# Patient Record
Sex: Male | Born: 1990 | Race: White | Hispanic: No | Marital: Single | Smoking: Never smoker
Health system: Southern US, Community
[De-identification: ages and names within clinical notes are randomized; demographics above are authoritative.]

---
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
## 2011-04-02 NOTE — Progress Notes (Signed)
Summary: foot injury/TM (rm 5)   Vital Signs:  Patient Profile:   20 Years Old Male CC:      right foot pain, swelling and redness x 5 days Height:     72 inches Weight:      136 pounds O2 Sat:      100 % O2 treatment:    Room Air Temp:     97.7 degrees F oral Pulse rate:   80 / minute Resp:     14 per minute BP sitting:   121 / 82  (left arm) Cuff size:   regular  Pt. in pain?   yes    Location:   rigth foot    Type:       dull  Vitals Entered By: Lajean Saver RN (August 27, 2010 2:06 PM)                   Updated Prior Medication List: No Medications Current Allergies: No known allergies History of Present Illness Chief Complaint: right foot pain, swelling and redness x 5 days History of Present Illness:  Subjective:  Patient complains of onset of a mildly tender bump on his left first foot MTP joint about 5 days ago that has gradually become more tender, swollen, warm, and erythematous.  No fevers, chills, and sweats.  He has pain with weight bearing.  He plays hockey, but recalls no recent injury, and no recent change in shoe wear. No family history of gout  REVIEW OF SYSTEMS Constitutional Symptoms      Denies fever, chills, night sweats, weight loss, weight gain, and fatigue.  Eyes       Denies change in vision, eye pain, eye discharge, glasses, contact lenses, and eye surgery. Ear/Nose/Throat/Mouth       Denies hearing loss/aids, change in hearing, ear pain, ear discharge, dizziness, frequent runny nose, frequent nose bleeds, sinus problems, sore throat, hoarseness, and tooth pain or bleeding.  Respiratory       Denies dry cough, productive cough, wheezing, shortness of breath, asthma, bronchitis, and emphysema/COPD.  Cardiovascular       Denies murmurs, chest pain, and tires easily with exhertion.    Gastrointestinal       Denies stomach pain, nausea/vomiting, diarrhea, constipation, blood in bowel movements, and indigestion. Genitourniary       Denies  painful urination, kidney stones, and loss of urinary control. Neurological       Denies paralysis, seizures, and fainting/blackouts. Musculoskeletal       Denies muscle pain, joint pain, joint stiffness, decreased range of motion, redness, swelling, muscle weakness, and gout.  Skin       Denies bruising, unusual mles/lumps or sores, and hair/skin or nail changes.  Psych       Denies mood changes, temper/anger issues, anxiety/stress, speech problems, depression, and sleep problems. Other Comments: Unknnown cause of right foot pain, redness and swelling. Warm to touch   Past History:  Past Medical History: Unremarkable  Past Surgical History: Denies surgical history  Family History: none  Social History: Never Smoked Alcohol use-no Drug use-no Smoking Status:  never Drug Use:  no   Objective:  Appearance:  Patient appears healthy, stated age, and in no acute distress  Left ankle:  Full range of motion without tenderness Left foot:  Distinct tenderness and prominence over the first MTP joint medially with overlying warmth and erythema extending to dorsum.  Minimal swelling.  No skin lesions or fluctuance.  Has pain with resisted dorsiflexion  of great toe. X-ray left foot:  Findings: No acute fracture is seen.  There is a fairly well corticated calcific density just medial to the head of the right first metatarsal with soft tissue swelling.  This is not typical of a sesamoid bone and could possibly be due to heterotopic bone from prior trauma.  No erosion is seen to indicate an arthritis.  Joint spaces appear normal.  Tarsal - metatarsal alignment is normal. 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 ETIOLOGY.  ? GOUT.  Plan New Medications/Changes: LORTAB 5 5-500 MG TABS (HYDROCODONE-ACETAMINOPHEN) One tab by  mouth hs as needed pain  #8 (eight) x 0, 08/27/2010, Donna Christen MD PREDNISONE 10 MG TABS (PREDNISONE) 2 PO today, then 2  BID for 2 days, then 1 two times a day for 2 days, then 1 daily for 2 days.  Take PC  #14 x 0, 08/27/2010, Donna Christen MD  New Orders: Services provided After hours-Weekends-Holidays [99051] T-DG Foot Complete*L* [73630] CBC w/Diff [16109-60454] T-Uric Acid (Blood) [09811-91478] New Patient Level IV [99204] Planning Comments:   Check serum uric acid.  Begin tapering course of prednisone.  Begin applying ice pack several times daily. Follow-up with orthopedist.  Limit athletic activity until evaluation by orthopedist.   The patient and/or caregiver has been counseled thoroughly with regard to medications prescribed including dosage, schedule, interactions, rationale for use, and possible side effects and they verbalize understanding.  Diagnoses and expected course of recovery discussed and will return if not improved as expected or if the condition worsens. Patient and/or caregiver verbalized understanding.  Prescriptions: LORTAB 5 5-500 MG TABS (HYDROCODONE-ACETAMINOPHEN) One tab by mouth hs as needed pain  #8 (eight) x 0   Entered and Authorized by:   Donna Christen MD   Signed by:   Donna Christen MD on 08/27/2010   Method used:   Print then Give to Patient   RxID:   [id number] PREDNISONE 10 MG TABS (PREDNISONE) 2 PO today, then 2  BID for 2 days, then 1 two times a day for 2 days, then 1 daily for 2 days.  Take PC  #14 x 0   Entered and Authorized by:   Donna Christen MD   Signed by:   Donna Christen MD on 08/27/2010   Method used:   Print then Give to Patient   RxID:   [id number]   Orders Added: 1)  Services provided After hours-Weekends-Holidays [99051] 2)  T-DG Foot Complete*L* [73630] 3)  CBC w/Diff [72536-64403] 4)  T-Uric Acid (Blood) [47425-95638] 5)  New Patient Level IV [75643]

## 2011-04-02 NOTE — Progress Notes (Signed)
Summary: Followup Call  Followup call to Dad's cell phone; notified or uric acid results. Donna Christen MD  August 28, 2010 11:06 AM

## 2011-11-16 ENCOUNTER — Emergency Department
Admission: EM | Admit: 2011-11-16 | Discharge: 2011-11-16 | Disposition: A | Discharge status: Home / Self Care | Payer: 59 | Source: Home / Self Care

## 2011-11-16 DIAGNOSIS — H6090 Unspecified otitis externa, unspecified ear: Secondary | ICD-10-CM

## 2011-11-16 DIAGNOSIS — H60399 Other infective otitis externa, unspecified ear: Secondary | ICD-10-CM

## 2011-11-16 MED ORDER — NEOMYCIN-POLYMYXIN-HC 3.5-10000-1 OT SOLN: 3.0000 [drp] | Freq: Four times a day (QID) | OTIC | Status: AC

## 2011-11-16 NOTE — ED Notes (Signed)
Bruce Williamson complains of ear pain for 3-4 weeks. Denies fever, chills or sweats. He was treated a few weeks ago for his ear pain and was treated with amoxicillin.

## 2011-11-16 NOTE — ED Provider Notes (Signed)
History     CSN: [id number]  Arrival date & time 11/16/11  1650   First MD Initiated Contact with Patient 11/16/11 1656      Chief Complaint  Patient presents with  . Otalgia    3-4 weeks  Patient is a 21 y.o. male presenting with ear pain.  Otalgia This is a recurrent problem. The current episode started more than 1 week ago. There is pain in the right ear. The problem occurs constantly. The problem has not changed since onset.There has been no fever. Pertinent negatives include no ear discharge, no headaches and no hearing loss. His past medical history does not include chronic ear infection. Past medical history comments: Pt treated by PCP with amoxicillin 2 weeks ago with no improvement in sxs. .  Pt has been swimming daily this summer.   History reviewed. No pertinent past medical history.  History reviewed. No pertinent past surgical history.  Family History  Problem Relation Age of Onset  . Cancer Neg Hx   . Heart failure Neg Hx     History  Substance Use Topics  . Smoking status: Never Smoker   . Smokeless tobacco: Never Used  . Alcohol Use: No      Review of Systems  HENT: Positive for ear pain. Negative for hearing loss and ear discharge.   Neurological: Negative for headaches.  All other systems reviewed and are negative.    Allergies  Review of patient's allergies indicates no known allergies.  Home Medications   Current Outpatient Rx  Name Route Sig Dispense Refill  . NEOMYCIN-POLYMYXIN-HC 3.5-10000-1 OT SOLN Right Ear Place 3 drops into the right ear 4 (four) times daily. 10 mL 0    BP 128/81  Pulse 66  Temp 98 F (36.7 C) (Oral)  Resp 16  Ht 6\' 1"  (1.854 m)  Wt 150 lb (68.04 kg)  BMI 19.79 kg/m2  SpO2 100%  Physical Exam  Constitutional: He appears well-developed and well-nourished.  HENT:  Head: Normocephalic and atraumatic.       R ear canal erythema and tenderness to otoscopic evaluation.   Eyes: Conjunctivae are normal. Pupils are  equal, round, and reactive to light.  Neck: Normal range of motion. Neck supple.  Cardiovascular: Normal rate and regular rhythm.   Pulmonary/Chest: Effort normal.  Abdominal: Soft.  Musculoskeletal: Normal range of motion.  Neurological: He is alert.    ED Course  Procedures (including critical care time)  Labs Reviewed - No data to display No results found.   1. Otitis externa       MDM  Will treat with cortisporin otic Discussed swimming pools and ear care.  Infectious red flags discussed.  Handout given.  Follow up as needed.      The patient and/or caregiver has been counseled thoroughly with regard to treatment plan and/or medications prescribed including dosage, schedule, interactions, rationale for use, and possible side effects and they verbalize understanding. Diagnoses and expected course of recovery discussed and will return if not improved as expected or if the condition worsens. Patient and/or caregiver verbalized understanding.             Floydene Flock, MD 11/16/11 1745

## 2011-11-17 NOTE — ED Provider Notes (Signed)
Agree with exam, assessment, and plan.   Lattie Haw, MD 11/17/11 [phone]

## 2012-05-07 IMAGING — CR DG FOOT 2V*R*
3 series · 3 of 3 positions shown · non-contrast
Comparison: None.

CLINICAL DATA: Pain, no trauma

RIGHT FOOT - 2 VIEW

[view not recorded (1 of 3)]
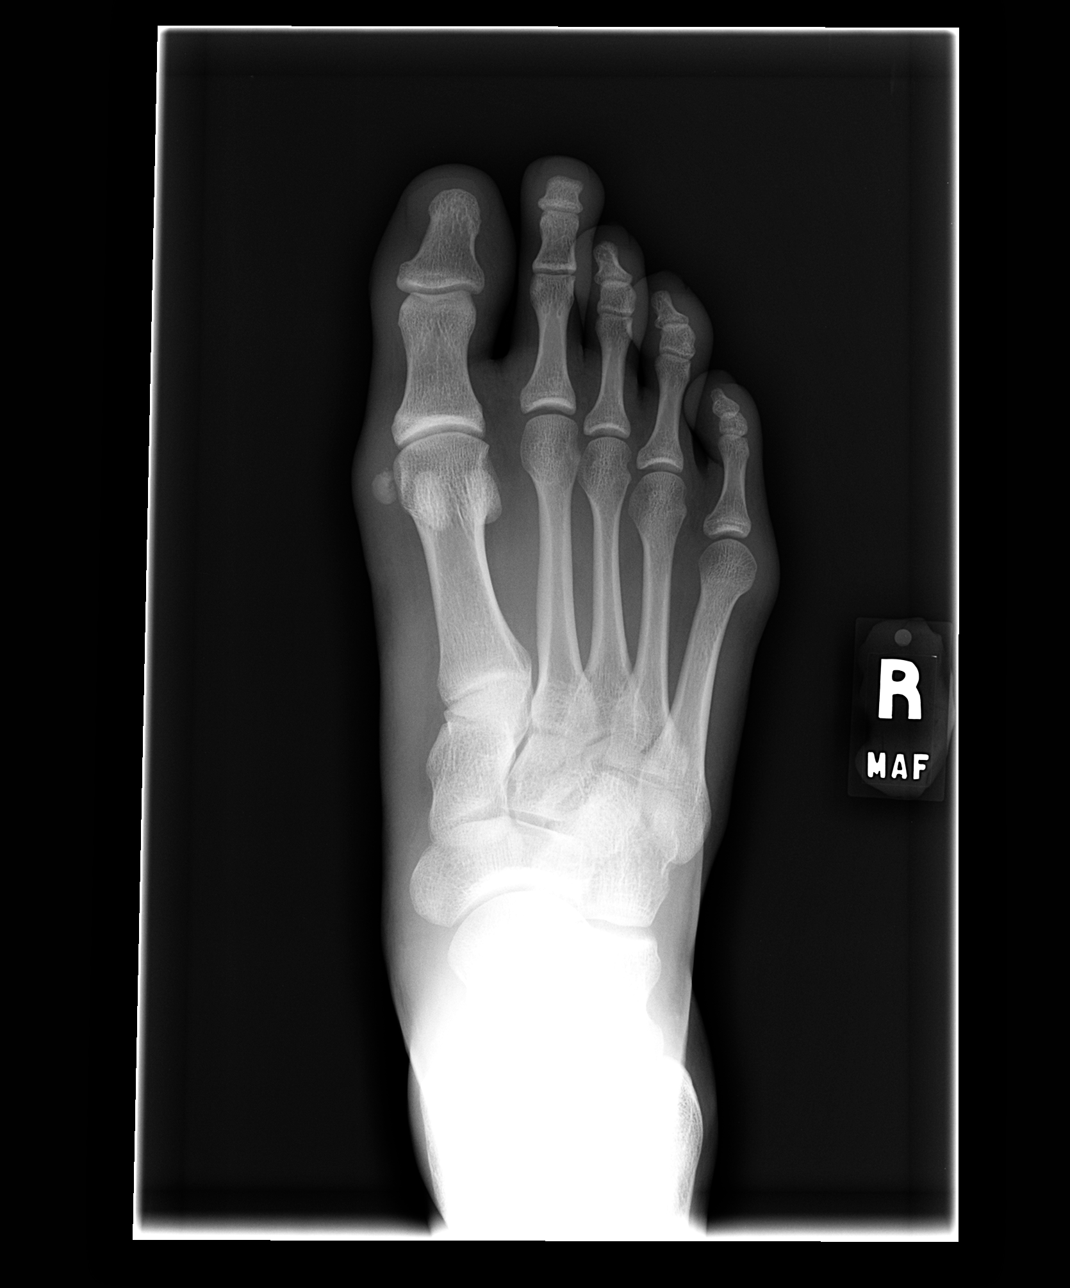

[view not recorded (2 of 3)]
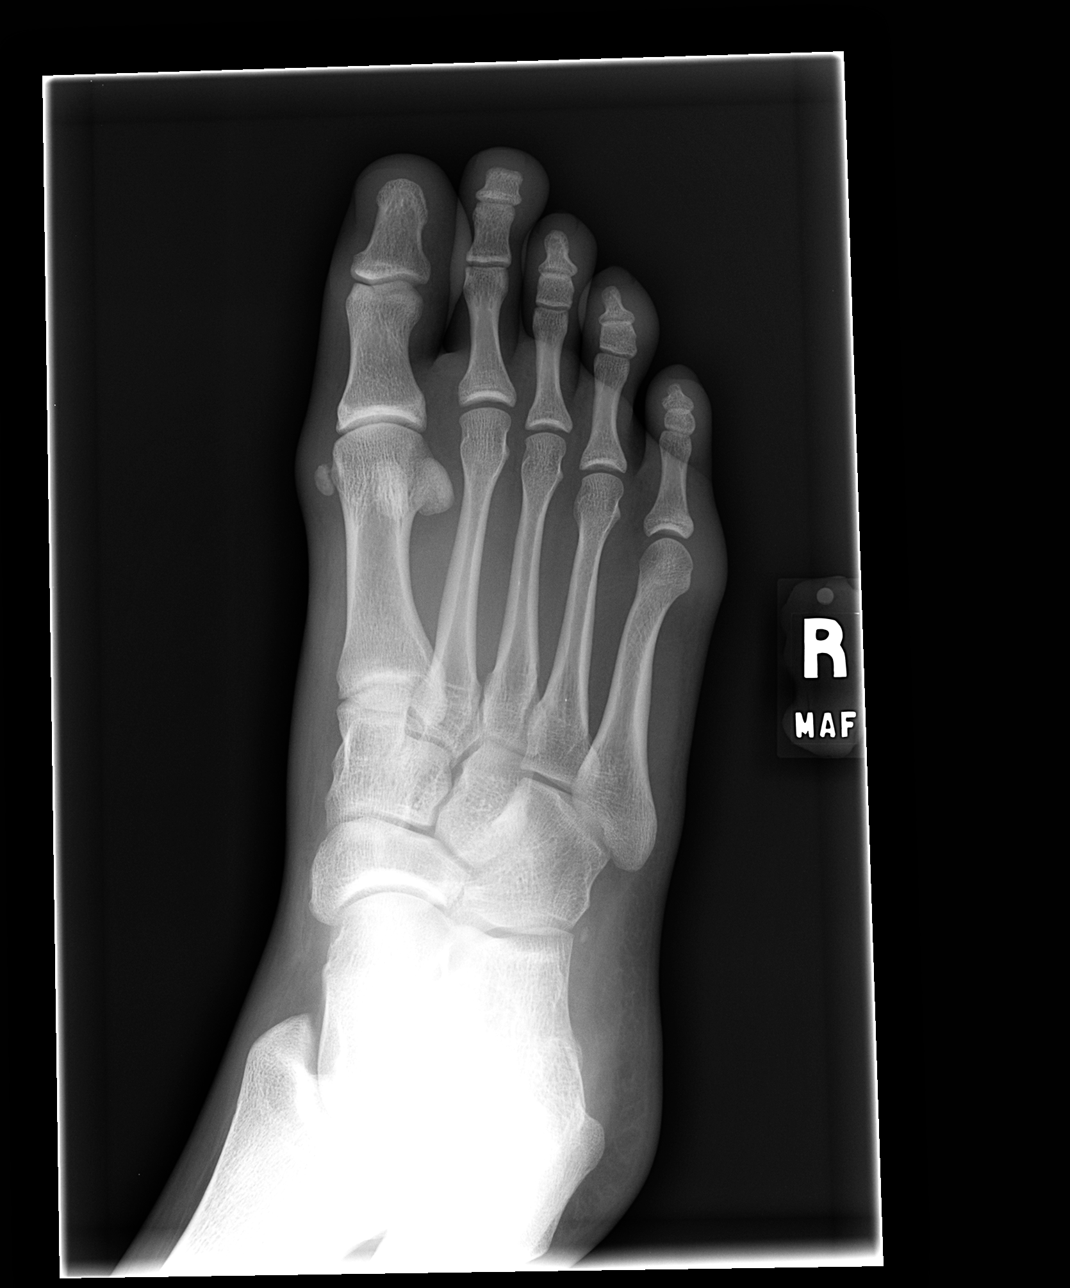

[view not recorded (3 of 3)]
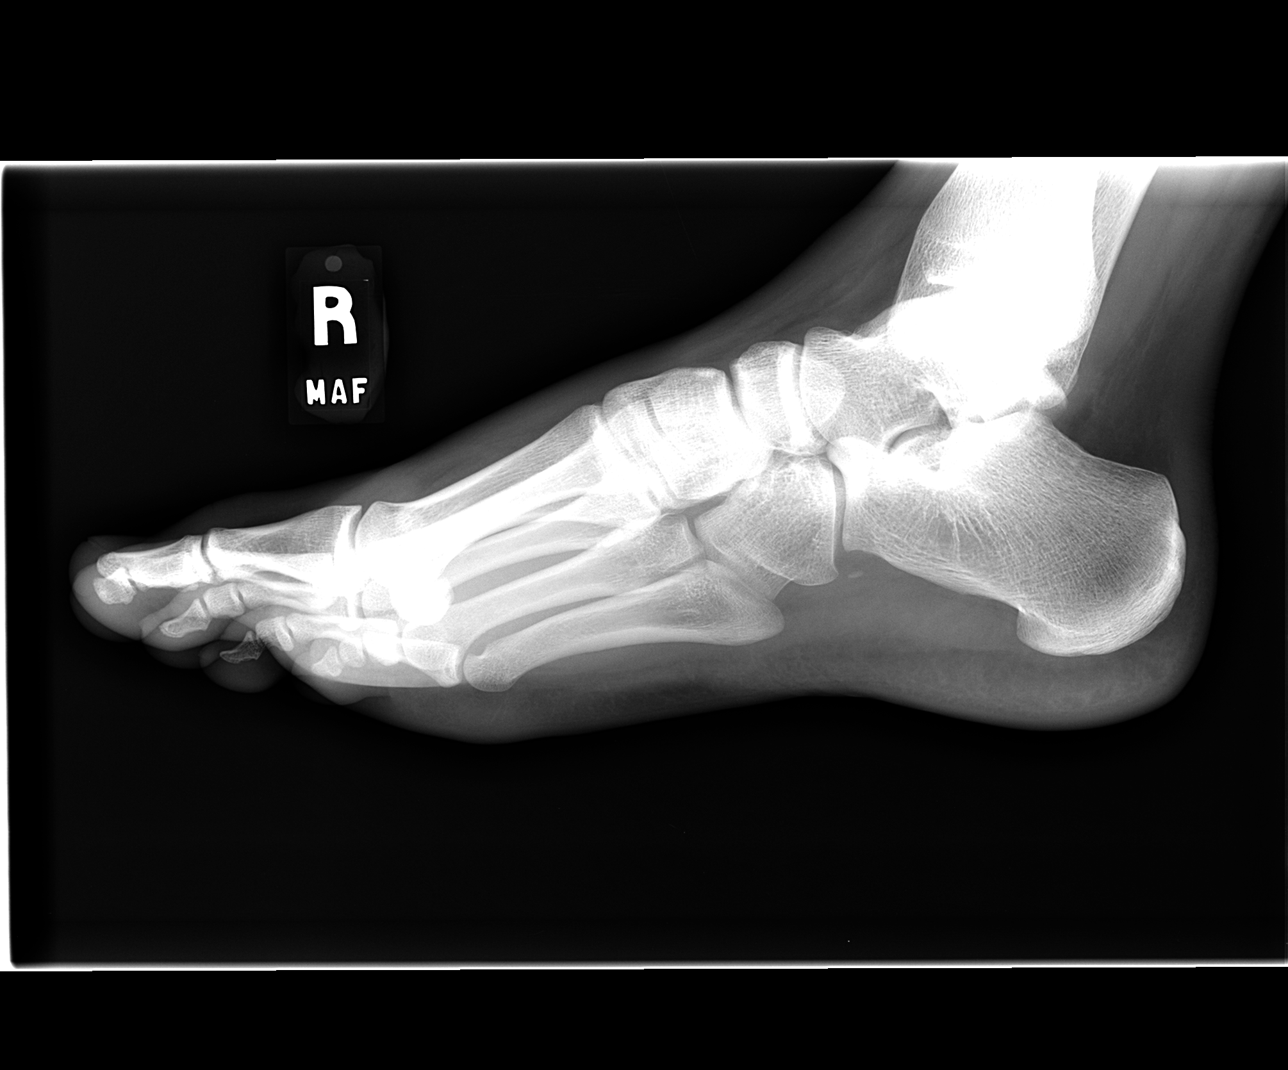

[3 of 3 positions shown; findings below may reference images not displayed]

FINDINGS: No acute fracture is seen.  There is a fairly well
corticated calcific density just medial to the head of the right
first metatarsal with soft tissue swelling.  This is not typical of
a sesamoid bone and could possibly be due to heterotopic bone from
prior trauma.  No erosion is seen to indicate an arthritis.  Joint
spaces appear normal.  Tarsal - metatarsal alignment is normal.
IMPRESSION: No acute abnormality.  Calcification in the soft tissues as noted
above may be due to prior trauma.

## 2012-08-21 ENCOUNTER — Emergency Department
Admission: EM | Admit: 2012-08-21 | Discharge: 2012-08-21 | Disposition: A | Discharge status: Home / Self Care | Payer: 59 | Source: Home / Self Care | Attending: Family Medicine | Admitting: Family Medicine

## 2012-08-21 ENCOUNTER — Encounter: Payer: Self-pay | Admitting: *Deleted

## 2012-08-21 DIAGNOSIS — H109 Unspecified conjunctivitis: Secondary | ICD-10-CM

## 2012-08-21 MED ORDER — POLYMYXIN B-TRIMETHOPRIM 10000-0.1 UNIT/ML-% OP SOLN: 1.0000 [drp] | OPHTHALMIC | Status: DC

## 2012-08-21 NOTE — ED Provider Notes (Signed)
History     CSN: [id number]  Arrival date & time 08/21/12  1145   First MD Initiated Contact with Patient 08/21/12 1155      Chief Complaint  Patient presents with  . Eye Problem   HPI Comments: Mild eye redness over the last 4-5 days.  No eye pain  No LOV No fevers or chills.  No headache  No itching    Patient is a 22 y.o. male presenting with eye problem.  Eye Problem Location:  R eye Quality: redness. Severity:  Mild Onset quality:  Gradual Chronicity:  New Context: not burn, not chemical exposure, not contact lens problem, not direct trauma, not foreign body, not using machinery, not scratch, not smoke exposure and not tanning booth use     History reviewed. No pertinent past medical history.  History reviewed. No pertinent past surgical history.  Family History  Problem Relation Age of Onset  . Cancer Neg Hx   . Heart failure Neg Hx     History  Substance Use Topics  . Smoking status: Never Smoker   . Smokeless tobacco: Never Used  . Alcohol Use: No      Review of Systems  All other systems reviewed and are negative.    Allergies  Review of patient's allergies indicates no known allergies.  Home Medications  No current outpatient prescriptions on file.  BP 154/90  Pulse 58  Temp(Src) 98 F (36.7 C) (Oral)  Resp 16  Ht 6\' 1"  (1.854 m)  Wt 161 lb (73.029 kg)  BMI 21.25 kg/m2  SpO2 99%  Physical Exam  Constitutional: He appears well-developed and well-nourished.  HENT:  Head: Normocephalic and atraumatic.  Eyes: Pupils are equal, round, and reactive to light.    Mild R eye conjunctivitis No foreign body      ED Course  Procedures (including critical care time)  Labs Reviewed - No data to display No results found.   1. Conjunctivitis unspecified       MDM  ? Viral etiology.  Will place on polytrim for bacterial coverage.  Discussed general, infectious and ENT red flags.  Follow up as needed.      The patient  and/or caregiver has been counseled thoroughly with regard to treatment plan and/or medications prescribed including dosage, schedule, interactions, rationale for use, and possible side effects and they verbalize understanding. Diagnoses and expected course of recovery discussed and will return if not improved as expected or if the condition worsens. Patient and/or caregiver verbalized understanding.             Doree Albee, MD 08/21/12 [phone]

## 2012-08-21 NOTE — ED Notes (Signed)
Right eye redness and pain x 1 week. Denies any drainage.

## 2012-12-09 ENCOUNTER — Emergency Department
Admission: EM | Admit: 2012-12-09 | Discharge: 2012-12-09 | Disposition: A | Discharge status: Home / Self Care | Payer: 59 | Source: Home / Self Care

## 2012-12-09 ENCOUNTER — Encounter: Payer: Self-pay | Admitting: *Deleted

## 2012-12-09 DIAGNOSIS — Z23 Encounter for immunization: Secondary | ICD-10-CM

## 2012-12-09 MED ORDER — TETANUS-DIPHTH-ACELL PERTUSSIS 5-2.5-18.5 LF-MCG/0.5 IM SUSP
0.5000 mL | Freq: Once | INTRAMUSCULAR | Status: AC
  Administered 2012-12-09: 0.5 mL via INTRAMUSCULAR

## 2012-12-09 NOTE — ED Notes (Signed)
The pt is here today for a Tdap vaccine.  

## 2014-08-04 ENCOUNTER — Ambulatory Visit (INDEPENDENT_AMBULATORY_CARE_PROVIDER_SITE_OTHER): Payer: 59 | Admitting: Physician Assistant

## 2014-08-04 ENCOUNTER — Ambulatory Visit: Payer: Self-pay | Admitting: Physician Assistant

## 2014-08-04 ENCOUNTER — Encounter: Payer: Self-pay | Admitting: Physician Assistant

## 2014-08-04 VITALS — BP 137/80 | HR 62 | Ht 72.0 in | Wt 156.0 lb

## 2014-08-04 DIAGNOSIS — J039 Acute tonsillitis, unspecified: Secondary | ICD-10-CM

## 2014-08-04 DIAGNOSIS — J028 Acute pharyngitis due to other specified organisms: Secondary | ICD-10-CM

## 2014-08-04 LAB — POCT RAPID STREP A (OFFICE): Rapid Strep A Screen: NEGATIVE

## 2014-08-04 MED ORDER — PREDNISONE 50 MG PO TABS: ORAL_TABLET | ORAL | Status: AC

## 2014-08-04 MED ORDER — AMOXICILLIN 500 MG PO CAPS: 500.0000 mg | ORAL_CAPSULE | Freq: Two times a day (BID) | ORAL | Status: AC

## 2014-08-04 NOTE — Patient Instructions (Signed)

## 2014-08-04 NOTE — Progress Notes (Signed)
   Subjective:    Patient ID: Bruce Williamson, male    DOB: 10/10/1990, 24 y.o.   MRN: [id number]  HPI Pt is a 24 yo male who presents to the clinic to establish care.   NO PMH>  NO medications.  NO FMH.   .. History   Social History  . Marital Status: Single    Spouse Name: N/A  . Number of Children: N/A  . Years of Education: N/A   Occupational History  . Not on file.   Social History Main Topics  . Smoking status: Never Smoker   . Smokeless tobacco: Never Used  . Alcohol Use: No  . Drug Use: No  . Sexual Activity: Not on file   Other Topics Concern  . 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   He has had a ST for 4-5 days. That is only symptoms. No fever, chills, nausea, vomiting, cough, ear pain, sinus pressure, SOB or wheezing. Not tried anything. Does have some on and off pressure behind both eyes. No hx of allergies. No sneezing or watery eyes. No recent sick contacts.   Review of Systems  All other systems reviewed and are negative.      Objective:   Physical Exam  Constitutional: He is oriented to person, place, and time.  HENT:  Head: Normocephalic and atraumatic.  Right Ear: External ear normal.  Left Ear: External ear normal.  Nose: Nose normal.  Mouth/Throat: No oropharyngeal exudate.  TM's clear bilaterally.   Oropharynx erythematous with bilaterally enlarged tonsils 1+. No exudate. Bad odor from breath.   Negative for any maxillary or frontal sinus tenderness.   Eyes: Conjunctivae are normal. Right eye exhibits no discharge. Left eye exhibits no discharge.  Neck: Normal range of motion. Neck supple.  Cardiovascular: Normal rate, regular rhythm and normal heart sounds.   Pulmonary/Chest: Effort normal and breath sounds normal. He has no wheezes.  Lymphadenopathy:    He has no cervical adenopathy.  Neurological: He is alert and oriented to person, place, and time.  Skin: Skin is dry.   Psychiatric: He has a normal mood and affect. His behavior is normal.          Assessment & Plan:  Acute pharyngitis/tonsilitis- rapid strep negative. Tonsils are enlarged. Will treat with prednisone for 5 days. If not improving then start amoxil for 10 days. Gargle with salt water. Ibuprofen for pain.

## 2014-08-06 DIAGNOSIS — J028 Acute pharyngitis due to other specified organisms: Secondary | ICD-10-CM | POA: Insufficient documentation

## 2014-08-06 DIAGNOSIS — J039 Acute tonsillitis, unspecified: Secondary | ICD-10-CM | POA: Insufficient documentation

## 2022-09-28 ENCOUNTER — Ambulatory Visit: Admit: 2022-09-28 | Discharge: 2022-09-28 | Payer: PRIVATE HEALTH INSURANCE | Primary: Diagnostic Radiology

## 2022-09-28 DIAGNOSIS — J020 Streptococcal pharyngitis: Secondary | ICD-10-CM

## 2022-09-28 LAB — AMB POC STREP GO A DIRECT, DNA PROBE: Strep pyogenes DNA, POC: POSITIVE

## 2022-09-28 MED ORDER — AMOXICILLIN 500 MG PO CAPS
500 | ORAL_CAPSULE | Freq: Two times a day (BID) | ORAL | 0 refills | Status: AC
Start: 2022-09-28 — End: 2022-10-08

## 2022-09-28 NOTE — Patient Instructions (Signed)
You are positive for strep throat today  No evidence of peritonsillar abscess on exam, no difficulty breathing or swallowing that would necessitate further workup in the ED   Amoxicillin as ordered, 2x daily for 10 days  Tylenol/ibuprofen for fevers, chills, aches and pains  Stay home from work/school for 24 hours after starting antibiotics  Switch out toothbrush after 3 days  Lots of fluid, plenty of rest  Warm salt water gargles, throat lozenges  Follow up with PCP if symptoms persist or worsen  Go to ED if you develop any difficulty breathing or swallowing, or if vomiting and unable to tolerate food and fluids

## 2022-09-28 NOTE — Progress Notes (Signed)
Bernard Murphy (DOB:  [birth year]) is a 32 y.o. male,New patient, here for evaluation of the following chief complaint(s):  Pharyngitis (sore throat for 1 week, body aches)      Assessment & Plan :  Visit Diagnoses and Associated Orders       Strep pharyngitis    -  Primary    amoxicillin (AMOXIL) 500 MG capsule [451]           Sore throat        AMB POC STREP GO A DIRECT, DNA PROBE [43329 CPT(R)]                 You are positive for strep throat today  No evidence of peritonsillar abscess on exam, no difficulty breathing or swallowing that would necessitate further workup in the ED   Amoxicillin as ordered, 2x daily for 10 days  Tylenol/ibuprofen for fevers, chills, aches and pains  Stay home from work/school for 24 hours after starting antibiotics  Switch out toothbrush after 3 days  Lots of fluid, plenty of rest  Warm salt water gargles, throat lozenges  Follow up with PCP if symptoms persist or worsen  Go to ED if you develop any difficulty breathing or swallowing, or if vomiting and unable to tolerate food and fluids    Your blood pressure was elevated today. Please monitor this at home over the next several weeks and follow up with PCP if readings remain above 140/90       Subjective :    Pharyngitis       32 y.o. male presents with symptoms of sore throat and body aches for about 1 week. Felt like symptoms were improving briefly but then worsened again several days ago. Denies fevers, chills or fatigue. No ear pain, nasal congestion, or cough. No shortness of breath or wheezing, no difficulty breathing or swallowing. No known sick contacts. History of strep in the past and this feels similar.         Vitals:    09/28/22 1218 09/28/22 1225   BP: (!) 147/96 (!) 142/94   Pulse: 78    Resp: 18    Temp: 98.4 F (36.9 C)    SpO2: 98%    Weight: 89.8 kg (198 lb)    Height: 1.854 m (6\' 1" )        Results for POC orders placed in visit on 09/28/22   AMB POC STREP GO A DIRECT, DNA PROBE   Result Value Ref Range    Valid  Internal Control, POC Pass     Strep pyogenes DNA, POC Positive          Objective   Physical Exam  Vitals and nursing note reviewed.   Constitutional:       General: He is not in acute distress.     Appearance: Normal appearance. He is ill-appearing.   HENT:      Head: Normocephalic and atraumatic.      Right Ear: Tympanic membrane, ear canal and external ear normal. There is no impacted cerumen.      Left Ear: Tympanic membrane, ear canal and external ear normal. There is no impacted cerumen.      Nose: Nose normal. No congestion or rhinorrhea.      Mouth/Throat:      Mouth: Mucous membranes are moist.      Pharynx: Uvula midline. Pharyngeal swelling, oropharyngeal exudate and posterior oropharyngeal erythema present. No uvula swelling.      Tonsils: Tonsillar exudate present.  No tonsillar abscesses. 2+ on the right. 2+ on the left.   Cardiovascular:      Rate and Rhythm: Normal rate and regular rhythm.      Pulses: Normal pulses.      Heart sounds: Normal heart sounds. No murmur heard.     No friction rub. No gallop.   Pulmonary:      Effort: Pulmonary effort is normal. No respiratory distress.      Breath sounds: Normal breath sounds. No wheezing, rhonchi or rales.   Musculoskeletal:      Cervical back: Normal range of motion and neck supple. Tenderness present.   Lymphadenopathy:      Cervical: Cervical adenopathy present.   Skin:     General: Skin is warm and dry.   Neurological:      General: No focal deficit present.      Mental Status: He is alert and oriented to person, place, and time.               An electronic signature was used to authenticate this note.    Judie Bonus, APRN - NP
